# Patient Record
Sex: Female | Born: 1970 | Race: White | Hispanic: No | Marital: Single | Smoking: Never smoker
Health system: Southern US, Community
[De-identification: ages and names within clinical notes are randomized; demographics above are authoritative.]

## PROBLEM LIST (undated history)

## (undated) HISTORY — PX: REDUCTION MAMMAPLASTY: SUR839

---

## 2018-06-08 ENCOUNTER — Other Ambulatory Visit: Payer: Self-pay | Admitting: Obstetrics and Gynecology

## 2018-06-08 DIAGNOSIS — R928 Other abnormal and inconclusive findings on diagnostic imaging of breast: Secondary | ICD-10-CM

## 2018-06-17 ENCOUNTER — Ambulatory Visit
Admission: RE | Admit: 2018-06-17 | Discharge: 2018-06-17 | Disposition: A | Discharge status: Home / Self Care | Payer: Managed Care, Other (non HMO) | Source: Ambulatory Visit | Attending: Obstetrics and Gynecology | Admitting: Obstetrics and Gynecology

## 2018-06-17 ENCOUNTER — Ambulatory Visit: Payer: Self-pay

## 2018-06-17 ENCOUNTER — Other Ambulatory Visit: Payer: Self-pay | Admitting: Obstetrics and Gynecology

## 2018-06-17 DIAGNOSIS — R928 Other abnormal and inconclusive findings on diagnostic imaging of breast: Secondary | ICD-10-CM

## 2018-06-21 ENCOUNTER — Other Ambulatory Visit: Payer: Self-pay

## 2018-06-21 DIAGNOSIS — D259 Leiomyoma of uterus, unspecified: Secondary | ICD-10-CM | POA: Insufficient documentation

## 2018-06-21 DIAGNOSIS — R519 Headache, unspecified: Secondary | ICD-10-CM | POA: Insufficient documentation

## 2018-06-21 DIAGNOSIS — N8003 Adenomyosis of the uterus: Secondary | ICD-10-CM | POA: Insufficient documentation

## 2018-06-21 DIAGNOSIS — N939 Abnormal uterine and vaginal bleeding, unspecified: Secondary | ICD-10-CM | POA: Insufficient documentation

## 2019-05-01 ENCOUNTER — Other Ambulatory Visit: Payer: Self-pay

## 2019-05-03 ENCOUNTER — Encounter: Payer: Self-pay | Admitting: Family Medicine

## 2019-05-03 ENCOUNTER — Ambulatory Visit (INDEPENDENT_AMBULATORY_CARE_PROVIDER_SITE_OTHER): Payer: Managed Care, Other (non HMO) | Admitting: Family Medicine

## 2019-05-03 ENCOUNTER — Other Ambulatory Visit: Payer: Self-pay

## 2019-05-03 ENCOUNTER — Ambulatory Visit: Payer: Self-pay

## 2019-05-03 VITALS — BP 148/98 | HR 86 | Ht 63.5 in | Wt 176.8 lb

## 2019-05-03 DIAGNOSIS — G8929 Other chronic pain: Secondary | ICD-10-CM | POA: Diagnosis not present

## 2019-05-03 DIAGNOSIS — R030 Elevated blood-pressure reading, without diagnosis of hypertension: Secondary | ICD-10-CM

## 2019-05-03 DIAGNOSIS — M7711 Lateral epicondylitis, right elbow: Secondary | ICD-10-CM | POA: Diagnosis not present

## 2019-05-03 DIAGNOSIS — M25521 Pain in right elbow: Secondary | ICD-10-CM

## 2019-05-03 NOTE — Patient Instructions (Addendum)
   Thank you for coming in today.  Please perform the exercise program that we have prepared for you and gone over in detail on a daily basis.  In addition to the handout you were provided you can access your program through: [email or web address]-[email or web address]   Your unique program code is:  [id number]   Use the over the counter voltaren gel 4x dialy for pain as needed.  Use either a wrist brace or tennis elbow (conunter force brace) as needed.  Recheck in 6 weeks.  Return sooner if needed .  I recommend Dr Jonni Sanger here for primary care and blood pressure.   We're moving!  Dr. Clovis Riley new office will be located at [address] on the 1st floor.  This location is across the street from the [employer] and in the same complex as the [facility] and [employer]  Our new office phone number will be [phone].  We anticipate beginning to see patients at the [facility] office in early December 2020.

## 2019-05-03 NOTE — Progress Notes (Signed)
Subjective:    CC: Elbow pain  I, Emily Mckay, LAT, ATC, am serving as scribe for [name].  HPI: Pt is a 48 y/o RHD female presenting w/ c/o R elbow pain since Sept/Oct 2020 after moving into her new home.  She does report one heavy bin hitting her on the R elbow which is likely the main MOI.  She has been wearing a Copper sleeve on her R elbow.  She works as a Electrical engineer at [employer] and notes pain w/ mouse activities as well as repetitive use activities.  She rates her pain as a 6-7/10 on average and describes it as a soreness like her forearm is bruised.  She denies any numbness/tingling.  Aggravating factors include full R elbow extension and ADLs/IADLs such as fixing her hair and picking up things like a 1/2 gallon of milk.  She has tried Aleve and Motrin as well as a compression sleeve.  Past medical history, Surgical history, Family history not pertinant except as noted below, Social history, Allergies, and medications have been entered into the medical record, reviewed, and no changes needed.   Review of Systems: No headache, visual changes, nausea, vomiting, diarrhea, constipation, dizziness, abdominal pain, skin rash, fevers, chills, night sweats, weight loss, swollen lymph nodes, body aches, joint swelling, muscle aches, chest pain, shortness of breath, mood changes, visual or auditory hallucinations.   Objective:    Vitals:   05/03/19 1037  BP: (!) 148/98  Pulse: 86  SpO2: 98%   General: Well Developed, well nourished, and in no acute distress.  Neuro/Psych: Alert and oriented x3, extra-ocular muscles intact, able to move all 4 extremities, sensation grossly intact. Skin: Warm and dry, no rashes noted.  Respiratory: Not using accessory muscles, speaking in full sentences, trachea midline.  Cardiovascular: Pulses palpable, no extremity edema. Abdomen: Does not appear distended. MSK:  Right elbow normal-appearing normal motion. Tender palpation lateral  epicondyle.  Nontender otherwise. Normal strength. Pain with resisted wrist and finger extension located at lateral elbow.  Right hand and wrist normal-appearing normal motion.  Pulses cap refill and sensation are intact.  Strength is intact however resisted wrist and hand extension are painful with pain at lateral elbow.   Lab and Radiology Results  Limited musculoskeletal ultrasound of right lateral elbow Lateral epicondyle with avulsion fleck consistent in appearance with classic findings of lateral epicondylitis.  Common extensor and tender with no visible defects or tears. Limited increased Doppler activity at avulsion fleck consistent with inflammation. Impression: Lateral epicondylitis  Impression and Recommendations:    Assessment and Plan: 48 y.o. female with right lateral elbow pain due to lateral epicondylitis.  Radial tunnel syndrome is a possibility as well but much less likely.  Plan to treat with home exercises and stretching program.  Additionally will use diclofenac gel and either counterforce brace or wrist brace as needed.  Recheck in 6 weeks or so.  Return sooner if needed.  Precautions reviewed.  Blood pressure elevated.  Recommend establish care and follow-up with primary care provider to follow-up blood pressure.  Also recommend home blood pressure log..   Orders Placed This Encounter  Procedures  . NO CHG - [place] UPPER RIGHT    Order Specific Question:   Reason for Exam (SYMPTOM  OR DIAGNOSIS REQUIRED)    Answer:   R elbow pain    Order Specific Question:   Preferred imaging location?    Answer:   [facility]   No orders of the  defined types were placed in this encounter.   Discussed warning signs or symptoms. Please see discharge instructions. Patient expresses understanding.   The above documentation has been reviewed and is accurate and complete Lynne Leader

## 2019-06-14 ENCOUNTER — Ambulatory Visit: Payer: Managed Care, Other (non HMO) | Admitting: Family Medicine

## 2019-06-23 ENCOUNTER — Ambulatory Visit: Payer: Managed Care, Other (non HMO) | Admitting: Family Medicine

## 2020-07-28 IMAGING — MG DIGITAL DIAGNOSTIC UNILATERAL RIGHT MAMMOGRAM WITH TOMO AND CAD
4 series · 4 of 12 positions shown · non-contrast
Comparison: June 06, 2018

CLINICAL DATA: 47-year-old patient recalled from recent new
baseline screening mammogram for evaluation of a possible asymmetry
in the right breast.Original screening mammogram report has a
left/right error. The area of concern circled on the images is in
the right breast.

EXAM:
DIGITAL DIAGNOSTIC UNILATERAL RIGHT MAMMOGRAM WITH CAD AND TOMO

[R ML synth-2D]
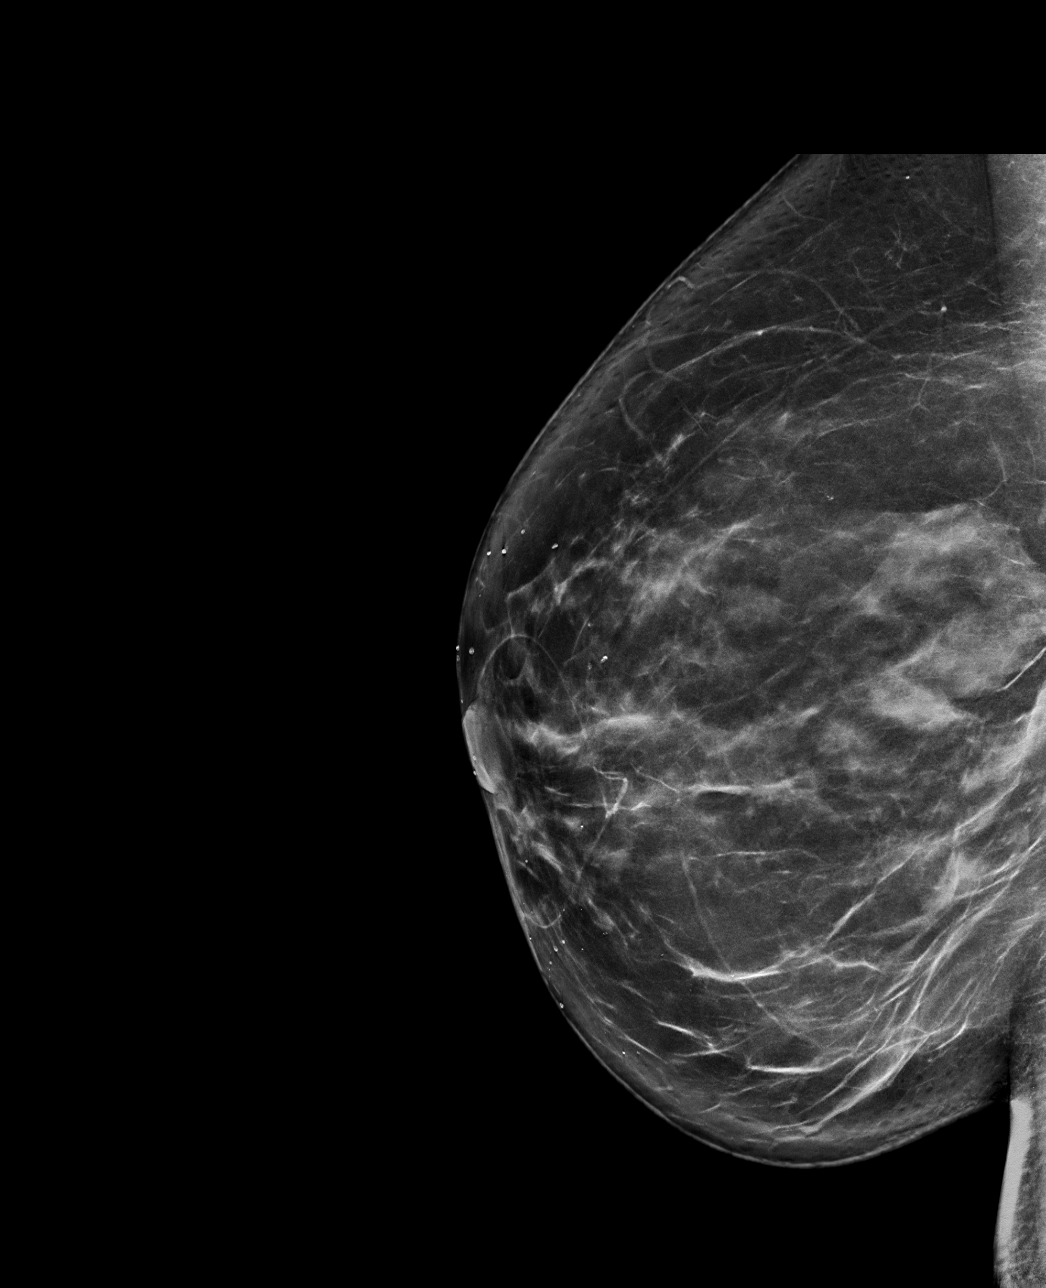

[R MLO synth-2D]
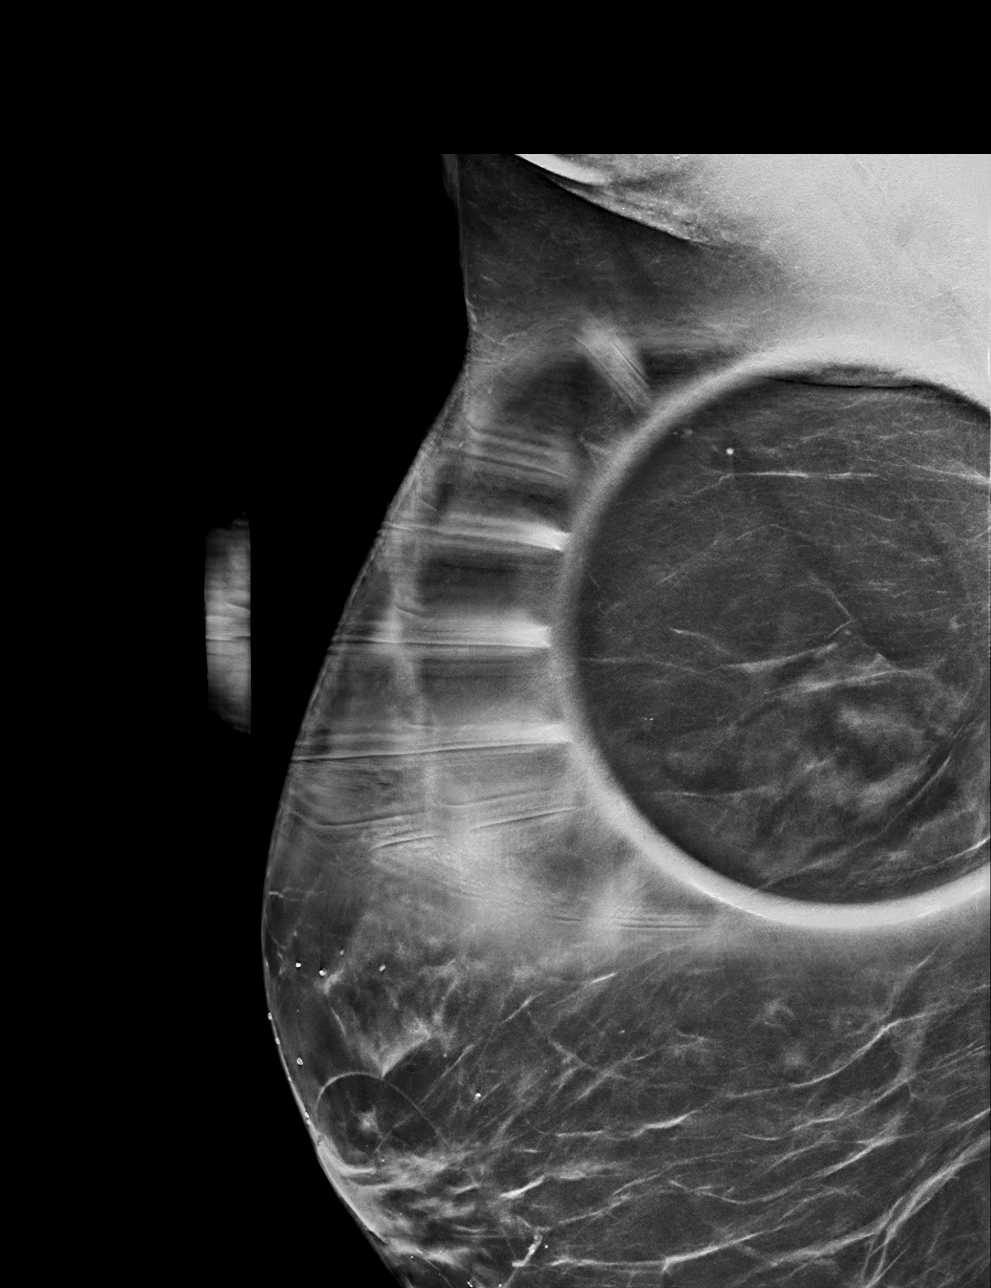

[R ML tomo · tomo slice 44/87.0]
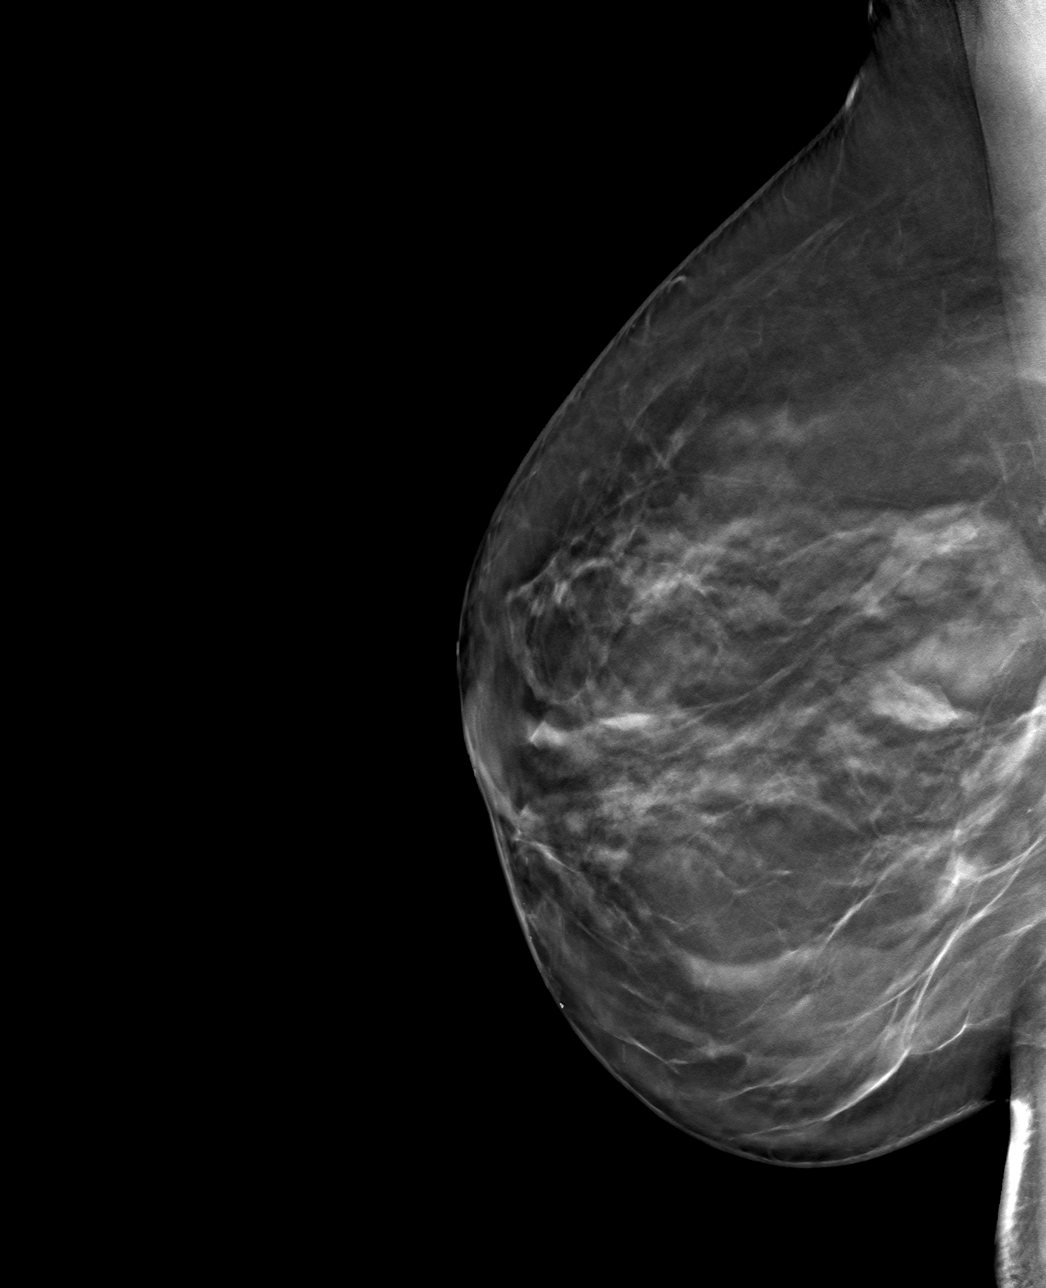

[R MLO tomo · tomo slice 47/93.0]
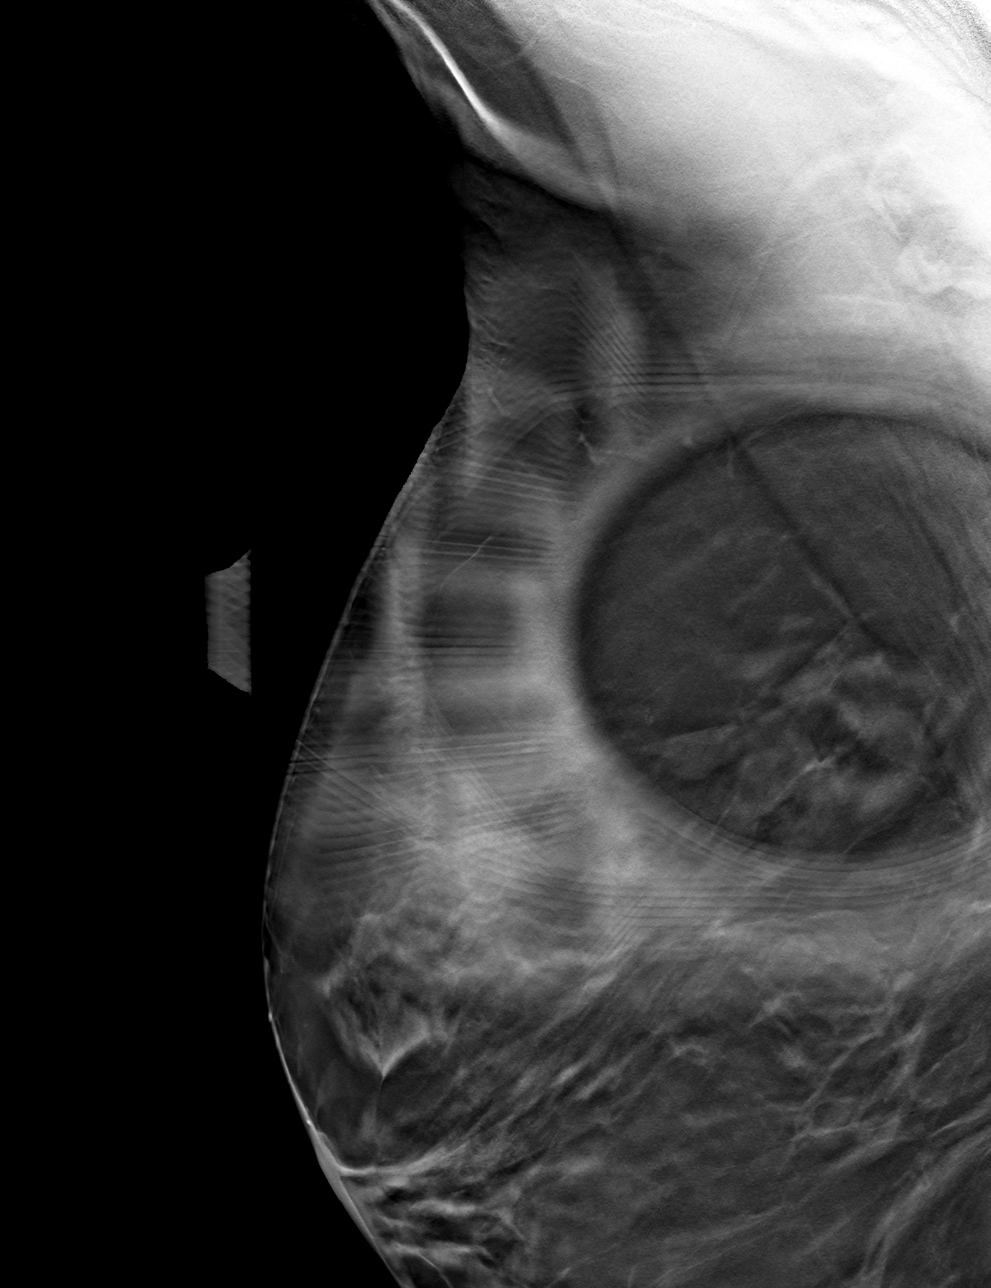

[4 of 12 positions shown; findings below may reference images not displayed]

ACR Breast Density Category b: There are scattered areas of
fibroglandular density.
FINDINGS: Spot compression views with tomography in the MLO projection of the
right breast show dispersion of fibroglandular tissue, without a
mass or distortion. 90 degree lateral view of the right breast is
negative. There are breast reduction changes.

Mammographic images were processed with CAD.
IMPRESSION: Normal appearing fibroglandular tissue accounts for the asymmetry in
the right breast. No findings to suggest malignancy.

RECOMMENDATION:
Screening mammogram in one year.(Code:[id number])

I have discussed the findings and recommendations with the patient.
Results were also provided in writing at the conclusion of the
visit. If applicable, a reminder letter will be sent to the patient
regarding the next appointment.

BI-RADS CATEGORY  1: Negative.

## 2020-09-09 DIAGNOSIS — N854 Malposition of uterus: Secondary | ICD-10-CM | POA: Insufficient documentation

## 2020-09-11 DIAGNOSIS — Z9189 Other specified personal risk factors, not elsewhere classified: Secondary | ICD-10-CM | POA: Insufficient documentation

## 2021-07-29 ENCOUNTER — Encounter (HOSPITAL_BASED_OUTPATIENT_CLINIC_OR_DEPARTMENT_OTHER): Payer: Self-pay | Admitting: Nurse Practitioner

## 2021-07-29 ENCOUNTER — Other Ambulatory Visit: Payer: Self-pay

## 2021-07-29 ENCOUNTER — Ambulatory Visit (INDEPENDENT_AMBULATORY_CARE_PROVIDER_SITE_OTHER): Payer: Commercial Managed Care - PPO | Admitting: Nurse Practitioner

## 2021-07-29 VITALS — Ht 64.0 in | Wt 183.6 lb

## 2021-07-29 DIAGNOSIS — Z7689 Persons encountering health services in other specified circumstances: Secondary | ICD-10-CM

## 2021-07-29 DIAGNOSIS — K219 Gastro-esophageal reflux disease without esophagitis: Secondary | ICD-10-CM

## 2021-07-29 DIAGNOSIS — K1370 Unspecified lesions of oral mucosa: Secondary | ICD-10-CM

## 2021-07-29 NOTE — Patient Instructions (Addendum)
Thank you for choosing [place] at [facility] for your Primary Care needs. I am excited for the opportunity to partner with you to meet your health care goals. It was a pleasure meeting you today!  Recommendations from today's visit: Vitamin D3 : I would recommend at least 1000 - 2000 units a day Calcium: I recommend 1200 mg a day I recommend monitoring your carbohydrates to 180 grams or less a day.  We can have you come back and get labs a physical exam.  I will research who would be a good option for nutrition for you.   Information on diet, exercise, and health maintenance recommendations are listed below. This is information to help you be sure you are on track for optimal health and monitoring.   Please look over this and let us know if you have any questions or if you have completed any of the health maintenance outside of [place] so that we can be sure your records are up to date.  ___________________________________________________________ About Me: I am an Adult-Geriatric Nurse Practitioner with a background in caring for patients for more than 20 years with a strong intensive care background. I provide primary care and sports medicine services to patients age 66 and older within this office. My education had a strong focus on caring for the older adult population, which I am passionate about. I am also the director of the APP Fellowship with [facility].   My desire is to provide you with the best service through preventive medicine and supportive care. I consider you a part of the medical team and value your input. I work diligently to ensure that you are heard and your needs are met in a safe and effective manner. I want you to feel comfortable with me as your provider and want you to know that your health concerns are important to me.  For your information, our office hours are: Monday, Tuesday, and Thursday 8:00 AM - 5:00 PM Wednesday and Friday 8:00 AM -  12:00 PM.   In my time away from the office I am teaching new APP's within the system and am unavailable, but my partner, Dr. Burnard Bunting is in the office for emergent needs.   If you have questions or concerns, please call our office at [phone] or send [place] a MyChart message and we will respond as quickly as possible.  ____________________________________________________________ MyChart:  For all urgent or time sensitive needs we ask that you please call the office to avoid delays. Our number is [phone]. MyChart is not constantly monitored and due to the large volume of messages a day, replies may take up to 72 business hours.  MyChart Policy: MyChart allows for you to see your visit notes, after visit summary, provider recommendations, lab and tests results, make an appointment, request refills, and contact your provider or the office for non-urgent questions or concerns. Providers are seeing patients during normal business hours and do not have built in time to review MyChart messages.  We ask that you allow a minimum of 3 business days for responses to [employer]. For this reason, please do not send urgent requests through [place]. Please call the office at [phone]. New and ongoing conditions may require a visit. We have virtual and in person visit available for your convenience.  Complex MyChart concerns may require a visit. Your provider may request you schedule a virtual or in person visit to ensure we are providing the best care possible. MyChart  messages sent after 11:00 AM on Friday will not be received by the provider until Monday morning.    Lab and Test Results: You will receive your lab and test results on MyChart as soon as they are completed and results have been sent by the lab or testing facility. Due to this service, you will receive your results BEFORE your provider.  I review lab and tests results each morning prior to seeing patients. Some results require  collaboration with other providers to ensure you are receiving the most appropriate care. For this reason, we ask that you please allow a minimum of 3-5 business days from the time the ALL results have been received for your provider to receive and review lab and test results and contact you about these.  Most lab and test result comments from the provider will be sent through [place]. Your provider may recommend changes to the plan of care, follow-up visits, repeat testing, ask questions, or request an office visit to discuss these results. You may reply directly to this message or call the office at [phone] to provide information for the provider or set up an appointment. In some instances, you will be called with test results and recommendations. Please let us know if this is preferred and we will make note of this in your chart to provide this for you.    If you have not heard a response to your lab or test results in 5 business days from all results returning to [place], please call the office to let us know. We ask that you please avoid calling prior to this time unless there is an emergent concern. Due to high call volumes, this can delay the resulting process.  After Hours: For all non-emergency after hours needs, please call the office at [phone] and select the option to reach the on-call provider service. On-call services are shared between multiple [place] offices and therefore it will not be possible to speak directly with your provider. On-call providers may provide medical advice and recommendations, but are unable to provide refills for maintenance medications.  For all emergency or urgent medical needs after normal business hours, we recommend that you seek care at the closest Urgent Care or Emergency Department to ensure appropriate treatment in a timely manner.  [facility] [place] has a 24 hour emergency room located on the ground floor for your convenience.    Urgent Concerns During the Business Day Providers are seeing patients from 8AM to [place] with a busy schedule and are most often not able to respond to non-urgent calls until the end of the day or the next business day. If you should have URGENT concerns during the day, please call and speak to the nurse or schedule a same day appointment so that we can address your concern without delay.   Thank you, again, for choosing me as your health care partner. I appreciate your trust and look forward to learning more about you.   Worthy Keeler, DNP, AGNP-c ___________________________________________________________  Health Maintenance Recommendations Screening Testing Mammogram Every 1 -2 years based on history and risk factors Starting at age 49 Pap Smear Ages 21-39 every 3 years Ages 52-65 every 5 years with HPV testing More frequent testing may be required based on results and history Colon Cancer Screening Every 1-10 years based on test performed, risk factors, and history Starting at age 1 Bone Density Screening Every 2-10 years based on history Starting at age 71 for women Recommendations for men  differ based on medication usage, history, and risk factors AAA Screening One time ultrasound Men 60-54 years old who have every smoked Lung Cancer Screening Low Dose Lung CT every 12 months Age 37-80 years with a 30 pack-year smoking history who still smoke or who have quit within the last 15 years  Screening Labs Routine  Labs: Complete Blood Count (CBC), Complete Metabolic Panel (CMP), Cholesterol (Lipid Panel) Every 6-12 months based on history and medications May be recommended more frequently based on current conditions or previous results Hemoglobin A1c Lab Every 3-12 months based on history and previous results Starting at age 61 or earlier with diagnosis of diabetes, high cholesterol, BMI >26, and/or risk factors Frequent monitoring for patients with diabetes to ensure blood  sugar control Thyroid Panel (TSH w/ T3 & T4) Every 6 months based on history, symptoms, and risk factors May be repeated more often if on medication HIV One time testing for all patients 53 and older May be repeated more frequently for patients with increased risk factors or exposure Hepatitis C One time testing for all patients 66 and older May be repeated more frequently for patients with increased risk factors or exposure Gonorrhea, Chlamydia Every 12 months for all sexually active persons 13-24 years Additional monitoring may be recommended for those who are considered high risk or who have symptoms PSA Men 95-22 years old with risk factors Additional screening may be recommended from age 76-69 based on risk factors, symptoms, and history  Vaccine Recommendations Tetanus Booster All adults every 10 years Flu Vaccine All patients 6 months and older every year COVID Vaccine All patients 12 years and older Initial dosing with booster May recommend additional booster based on age and health history HPV Vaccine 2 doses all patients age 18-26 Dosing may be considered for patients over 26 Shingles Vaccine (Shingrix) 2 doses all adults 75 years and older Pneumonia (Pneumovax 23) All adults 92 years and older May recommend earlier dosing based on health history Pneumonia (Prevnar 22) All adults 63 years and older Dosed 1 year after Pneumovax 23  Additional Screening, Testing, and Vaccinations may be recommended on an individualized basis based on family history, health history, risk factors, and/or exposure.  __________________________________________________________  Diet Recommendations for All Patients  I recommend that all patients maintain a diet low in saturated fats, carbohydrates, and cholesterol. While this can be challenging at first, it is not impossible and small changes can make big differences.  Things to try: Decreasing the amount of soda, sweet tea, and/or juice  to one or less per day and replace with water While water is always the first choice, if you do not like water you may consider adding a water additive without sugar to improve the taste other sugar free drinks Replace potatoes with a brightly colored vegetable at dinner Use healthy oils, such as canola oil or olive oil, instead of butter or hard margarine Limit your bread intake to two pieces or less a day Replace regular pasta with low carb pasta options Bake, broil, or grill foods instead of frying Monitor portion sizes  Eat smaller, more frequent meals throughout the day instead of large meals  An important thing to remember is, if you love foods that are not great for your health, you don't have to give them up completely. Instead, allow these foods to be a reward when you have done well. Allowing yourself to still have special treats every once in a while is a nice way to tell yourself  thank you for working hard to keep yourself healthy.   Also remember that every day is a new day. If you have a bad day and "fall off the wagon", you can still climb right back up and keep moving along on your journey!  We have resources available to help you!  Some websites that may be helpful include: www.http://[place].biz/  Www.[employer]Well[employer].com _____________________________________________________________  Activity Recommendations for All Patients  I recommend that all adults get at least 20 minutes of moderate physical activity that elevates your heart rate at least 5 days out of the week.  Some examples include: Walking or jogging at a pace that allows you to carry on a conversation Cycling (stationary bike or outdoors) Water aerobics Yoga Weight lifting Dancing If physical limitations prevent you from putting stress on your joints, exercise in a pool or seated in a chair are excellent options.  Do determine your MAXIMUM heart rate for activity: YOUR AGE - 220 = MAX HeartRate   Remember! Do not  push yourself too hard.  Start slowly and build up your pace, speed, weight, time in exercise, etc.  Allow your body to rest between exercise and get good sleep. You will need more water than normal when you are exerting yourself. Do not wait until you are thirsty to drink. Drink with a purpose of getting in at least 8, 8 ounce glasses of water a day plus more depending on how much you exercise and sweat.    If you begin to develop dizziness, chest pain, abdominal pain, jaw pain, shortness of breath, headache, vision changes, lightheadedness, or other concerning symptoms, stop the activity and allow your body to rest. If your symptoms are severe, seek emergency evaluation immediately. If your symptoms are concerning, but not severe, please let us know so that we can recommend further evaluation.

## 2021-07-29 NOTE — Progress Notes (Signed)
?Orma Render, DNP, AGNP-c ?Primary Care & Sports Medicine ?[place], [place] [address] ?[phone] ? ?New patient visit ? ? ?Patient: Emily Mckay   DOB: Aug 21, 1970   51 y.o. Female  MRN: [id number] ?Visit Date: 07/29/2021 ? ?Patient Care Team: ?Yehoshua Vitelli, Coralee Pesa, NP as PCP - General (Nurse Practitioner) ? ?Today's healthcare provider: Orma Render, NP  ? ?Chief Complaint  ?Patient presents with  ? Establish Care  ? ?Subjective  ?  ?Sabrina Keough Frankum is a 51 y.o. female who presents today as a new patient to establish care.  ?  ?Patient endorses the following concerns presently: ?Acid Reflux ?Aquita endorses ?- chronic acid reflux symptoms  ?- taking nexium '40mg'$  a day ?- concerned about long term effects of medication and risks associated with chronic use ?- improved symptoms with daily use ?- no nausea, vomiting, diarrhea, abdominal pain ? ?Oral Lesion ?- fleshy papule on oral mucous membrane of the left inner cheek ?- originated with frequent biting in the setting of clear inline braces system ?- has had removed in the past with no concerns for oral cancer, but has returned ?- orthodontist recommends that she has removed by oral surgeon- she would like referral today ? ?History reviewed and reveals the following: ?History reviewed. No pertinent past medical history. ?Past Surgical History:  ?Procedure Laterality Date  ? REDUCTION MAMMAPLASTY    ? ?Family Status  ?Relation Name Status  ? Sister  (Not Specified)  ? ?Family History  ?Problem Relation Age of Onset  ? Breast cancer Sister   ? ?Social History  ? ?Socioeconomic History  ? Marital status: Single  ?  Spouse name: Not on file  ? Number of children: Not on file  ? Years of education: Not on file  ? Highest education level: Not on file  ?Occupational History  ? Not on file  ?Tobacco Use  ? Smoking status: Never  ? Smokeless tobacco: Never  ?Substance and Sexual Activity  ? Alcohol use: Not Currently  ? Drug  use: Never  ? Sexual activity: Yes  ?Other Topics Concern  ? Not on file  ?Social History Narrative  ? Not on file  ? ?Social Determinants of Health  ? ?Financial Resource Strain: Not on file  ?Food Insecurity: Not on file  ?Transportation Needs: Not on file  ?Physical Activity: Not on file  ?Stress: Not on file  ?Social Connections: Not on file  ? ?Outpatient Medications Prior to Visit  ?Medication Sig  ? albuterol (VENTOLIN HFA) 108 (90 Base) MCG/ACT inhaler Inhale into the lungs.  ? [DISCONTINUED] naproxen sodium (ALEVE) 220 MG tablet Take 220 mg by mouth daily as needed.  ? ?No facility-administered medications prior to visit.  ? ?Allergies  ?Allergen Reactions  ? Penicillins Rash  ? ? ?There is no immunization history on file for this patient. ? ?Health Maintenance Due: ?Health Maintenance  ?Topic Date Due  ? HIV Screening  Never done  ? Hepatitis C Screening  Never done  ? TETANUS/TDAP  Never done  ? PAP SMEAR-Modifier  Never done  ? COLONOSCOPY (Pts 45-57yr Insurance coverage will need to be confirmed)  Never done  ? MAMMOGRAM  Never done  ? Zoster Vaccines- Shingrix (1 of 2) Never done  ? INFLUENZA VACCINE  Never done  ? HPV VACCINES  Aged Out  ? ? ?Review of Systems ?All review of systems negative except what is listed in the HPI ? ? Objective  ?  ?  Ht '5\' 4"'$  (1.626 m)   Wt 183 lb 9.6 oz (83.3 kg)   BMI 31.51 kg/m?  ?Physical Exam ?Vitals and nursing note reviewed.  ?Constitutional:   ?   General: She is not in acute distress. ?   Appearance: Normal appearance.  ?HENT:  ?   Mouth/Throat:  ?   Mouth: Mucous membranes are moist.  ?   Dentition: Gum lesions present.  ?   Comments: Appx 3-49m flesh colored papule on thin stalk on the left jaw line proximal to the corner of the mouth. No discoloration, bleeding, edema, or erythema present.  ?Eyes:  ?   Extraocular Movements: Extraocular movements intact.  ?   Conjunctiva/sclera: Conjunctivae normal.  ?   Pupils: Pupils are equal, round, and reactive to light.   ?Neck:  ?   Vascular: No carotid bruit.  ?Cardiovascular:  ?   Rate and Rhythm: Normal rate and regular rhythm.  ?   Pulses: Normal pulses.  ?   Heart sounds: Normal heart sounds. No murmur heard. ?Pulmonary:  ?   Effort: Pulmonary effort is normal.  ?   Breath sounds: Normal breath sounds. No wheezing.  ?Abdominal:  ?   General: Bowel sounds are normal.  ?   Palpations: Abdomen is soft.  ?Musculoskeletal:     ?   General: Normal range of motion.  ?   Cervical back: Normal range of motion.  ?   Right lower leg: No edema.  ?   Left lower leg: No edema.  ?Skin: ?   General: Skin is warm and dry.  ?   Capillary Refill: Capillary refill takes less than 2 seconds.  ?Neurological:  ?   General: No focal deficit present.  ?   Mental Status: She is alert and oriented to person, place, and time.  ?Psychiatric:     ?   Mood and Affect: Mood normal.     ?   Behavior: Behavior normal.     ?   Thought Content: Thought content normal.     ?   Judgment: Judgment normal.  ? ? ?No results found for any visits on 07/29/21. ? Assessment & Plan   ?  ? ?Problem List Items Addressed This Visit   ? ? Oral mucosal lesion - Primary  ?  Return of previously benign oral mucous membrane lesion related to repeated trauma from biting the inner cheek. ?No alarm symptoms present today.  ?Will send referral suggested by orthodontist for oral surgeon evaluation.  ?  ?  ? Relevant Orders  ? Ambulatory referral to Oral Maxillofacial Surgery  ? Gastroesophageal reflux disease  ?  Chronic. Well controlled with daily Nexium use. ?Recommend dietary alterations to help reduce symptoms, but discussed that chronic medication may be required based on symptoms when being off the medication.  ?Weight loss may help reduce the symptoms long term.  ?Recommend supplementation of calcium and vitamin D to help reduce chances of bone loss from chronic use.  ?She will f/u if sx worsen or fail to improve.  ?  ?  ? ?Other Visit Diagnoses   ? ? Encounter to establish  care      ? ?  ? ? ? ?Return for near future CPE and Labs (448m).  ?  ? ? ?Nain Rudd, SaCoralee PesaNP, DNP, AGNP-C ?[facility]  ? ?

## 2021-08-03 ENCOUNTER — Encounter (HOSPITAL_BASED_OUTPATIENT_CLINIC_OR_DEPARTMENT_OTHER): Payer: Self-pay | Admitting: Nurse Practitioner

## 2021-08-03 DIAGNOSIS — K219 Gastro-esophageal reflux disease without esophagitis: Secondary | ICD-10-CM | POA: Insufficient documentation

## 2021-08-03 DIAGNOSIS — K1370 Unspecified lesions of oral mucosa: Secondary | ICD-10-CM | POA: Insufficient documentation

## 2021-08-03 NOTE — Assessment & Plan Note (Signed)
Return of previously benign oral mucous membrane lesion related to repeated trauma from biting the inner cheek. ?No alarm symptoms present today.  ?Will send referral suggested by orthodontist for oral surgeon evaluation.  ?

## 2021-08-03 NOTE — Assessment & Plan Note (Signed)
Chronic. Well controlled with daily Nexium use. ?Recommend dietary alterations to help reduce symptoms, but discussed that chronic medication may be required based on symptoms when being off the medication.  ?Weight loss may help reduce the symptoms long term.  ?Recommend supplementation of calcium and vitamin D to help reduce chances of bone loss from chronic use.  ?She will f/u if sx worsen or fail to improve.  ?

## 2021-10-08 ENCOUNTER — Encounter (HOSPITAL_BASED_OUTPATIENT_CLINIC_OR_DEPARTMENT_OTHER): Payer: Commercial Managed Care - PPO | Admitting: Nurse Practitioner

## 2023-04-15 DIAGNOSIS — D225 Melanocytic nevi of trunk: Secondary | ICD-10-CM | POA: Insufficient documentation

## 2023-04-26 ENCOUNTER — Other Ambulatory Visit: Payer: Self-pay | Admitting: Family Medicine

## 2023-04-26 DIAGNOSIS — R945 Abnormal results of liver function studies: Secondary | ICD-10-CM

## 2023-06-10 ENCOUNTER — Ambulatory Visit
Admission: RE | Admit: 2023-06-10 | Discharge: 2023-06-10 | Disposition: A | Discharge status: Home / Self Care | Payer: Commercial Managed Care - PPO | Source: Ambulatory Visit | Attending: Family Medicine | Admitting: Family Medicine

## 2023-06-10 DIAGNOSIS — R945 Abnormal results of liver function studies: Secondary | ICD-10-CM

## 2023-08-04 ENCOUNTER — Ambulatory Visit (INDEPENDENT_AMBULATORY_CARE_PROVIDER_SITE_OTHER): Admitting: Podiatry

## 2023-08-04 DIAGNOSIS — M65971 Unspecified synovitis and tenosynovitis, right ankle and foot: Secondary | ICD-10-CM | POA: Diagnosis not present

## 2023-08-04 DIAGNOSIS — L84 Corns and callosities: Secondary | ICD-10-CM

## 2023-08-04 DIAGNOSIS — M65979 Unspecified synovitis and tenosynovitis, unspecified ankle and foot: Secondary | ICD-10-CM

## 2023-08-04 NOTE — Progress Notes (Signed)
  Subjective:  Patient ID: Emily Mckay, female    DOB: 27-Aug-1970,  MRN: [id number]  Chief Complaint  Patient presents with   Toe Pain    Place in between 4th and 5th toe causing discomfort she also stated that she has been noticing some foot pain after being on her feet all day     53 y.o. female presents with the above complaint.  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  Painful to touch is progressive gotten worse worse with ambulation worse with pressure certain type of shoes makes it worse.  She wants to discuss treatment options for it she has been noticing some pain especially after being on her feet all day pain scale 7 out of 10 dull achy in nature.  She does not wear any spacers   Review of Systems: Negative except as noted in the HPI. Denies N/V/F/Ch.  No past medical history on file.  Current Outpatient Medications:    gabapentin (NEURONTIN) 300 MG capsule, TAKE 1 CAPSULE BY MOUTH EVERYDAY AT BEDTIME Oral for 90 Days, Disp: , Rfl:    albuterol (VENTOLIN HFA) 108 (90 Base) MCG/ACT inhaler, Inhale into the lungs., Disp: , Rfl:    Ferrous Sulfate (IRON) 325 (65 Fe) MG TABS, 1 tablet Orally Three times a Week, Disp: , Rfl:    losartan (COZAAR) 25 MG tablet, Take 25 mg by mouth daily., Disp: , Rfl:    Multiple Vitamin (MULTI VITAMIN) TABS, 1 tablet Orally Once a day, Disp: , Rfl:   Social History   Tobacco Use  Smoking Status Never  Smokeless Tobacco Never    Allergies  Allergen Reactions   Penicillins Rash   Penicillamine     Other Reaction(s): Unknown   Objective:  There were no vitals filed for this visit. There is no height or weight on file to calculate BMI. Constitutional Well developed. Well nourished.  Vascular Dorsalis pedis pulses palpable bilaterally. Posterior tibial pulses palpable bilaterally. Capillary refill normal to all digits.  No cyanosis or clubbing noted. Pedal  hair growth normal.  Neurologic Normal speech. Oriented to person, place, and time. Epicritic sensation to light touch grossly present bilaterally.  Dermatologic Nails well groomed and normal in appearance. No open wounds. No skin lesions.  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  Pain on palpation pain with range of motion of the PIPJ joint of the fourth.   Radiographs: None Assessment:   1. Synovitis of toe   2. Heloma molle    Plan:  Patient was evaluated and treated and all questions answered.  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 This was well tolerated.   No follow-ups on file.
